# Patient Record
Sex: Male | Born: 1995 | Race: White | Hispanic: No | Marital: Single | Smoking: Never smoker
Health system: Southern US, Community
[De-identification: ages and names within clinical notes are randomized; demographics above are authoritative.]

---
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
## 2017-02-26 DIAGNOSIS — L74519 Primary focal hyperhidrosis, unspecified: Secondary | ICD-10-CM | POA: Diagnosis not present

## 2017-05-18 DIAGNOSIS — J029 Acute pharyngitis, unspecified: Secondary | ICD-10-CM | POA: Diagnosis not present

## 2017-05-21 DIAGNOSIS — J069 Acute upper respiratory infection, unspecified: Secondary | ICD-10-CM | POA: Diagnosis not present

## 2017-06-08 DIAGNOSIS — J029 Acute pharyngitis, unspecified: Secondary | ICD-10-CM | POA: Diagnosis not present

## 2017-07-13 IMAGING — MR MR SHOULDER*R* W/CM
4 of 6 series · 19 of 40 positions shown · IV contrast (agent unspecified)
Comparison: MR arthrogram dated 01/01/2012

CLINICAL DATA: Right shoulder pain and weakness.

EXAM:
MR ARTHROGRAM OF THE RIGHT SHOULDER
TECHNIQUE: Multiplanar, multisequence MR imaging of the right shoulder was
performed following the administration of intra-articular contrast.
CONTRAST:  See Injection Documentation.

[Series 3: T2 fat-sat · sagittal · 4.0mm · 0.44mm/px · 8 of 18 slices shown]
[im 1/18]
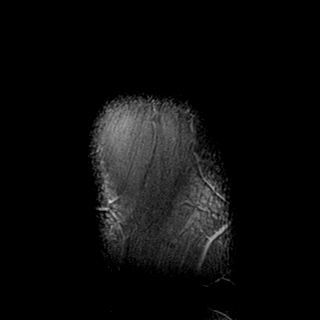
[im 3/18]
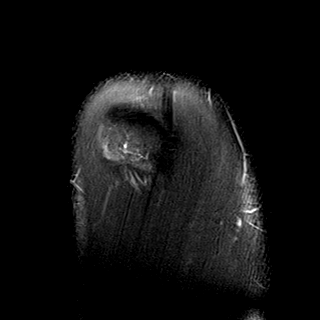
[im 5/18]
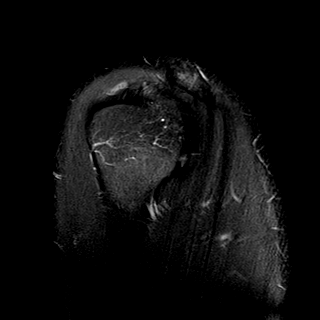
[im 8/18]
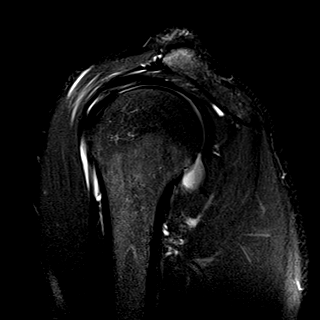
[im 10/18]
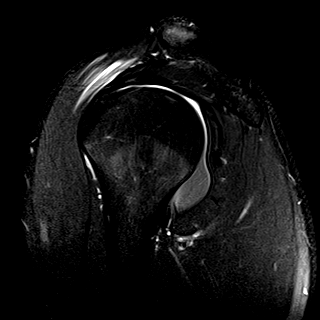
[im 13/18]
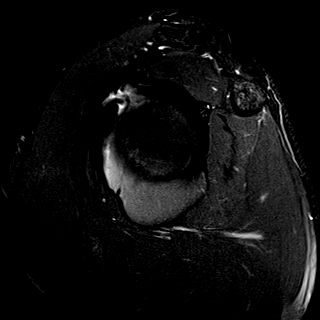
[im 15/18]
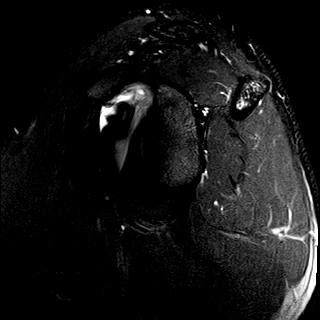
[im 18/18]
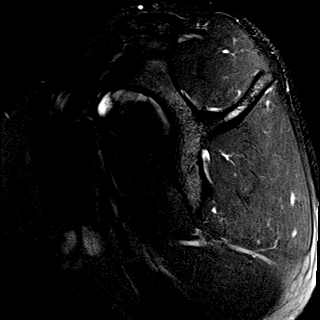

[Series 4: T1 fat-sat · oblique · 4.0mm · 0.22mm/px · 5 of 16 slices shown (1 of 2)]
[im 1/16]
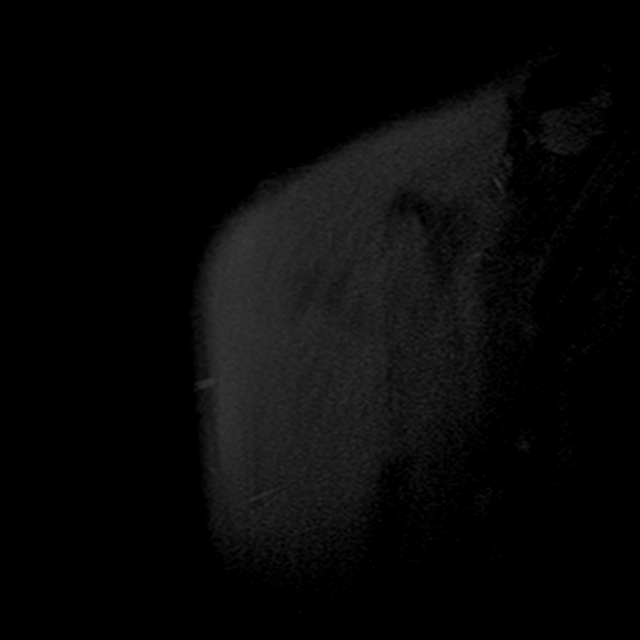
[im 4/16]
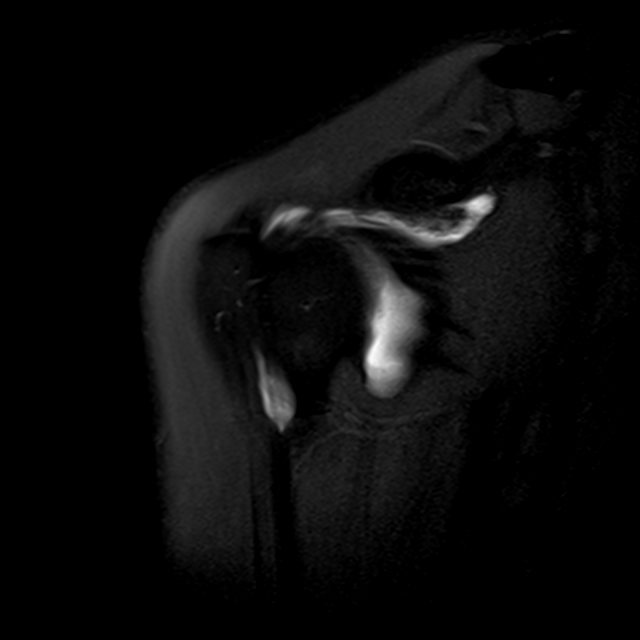
[im 7/16]
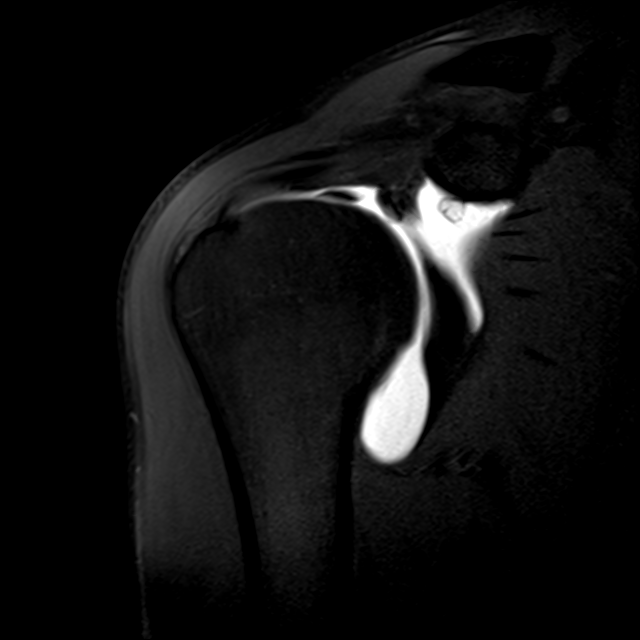
[im 10/16]
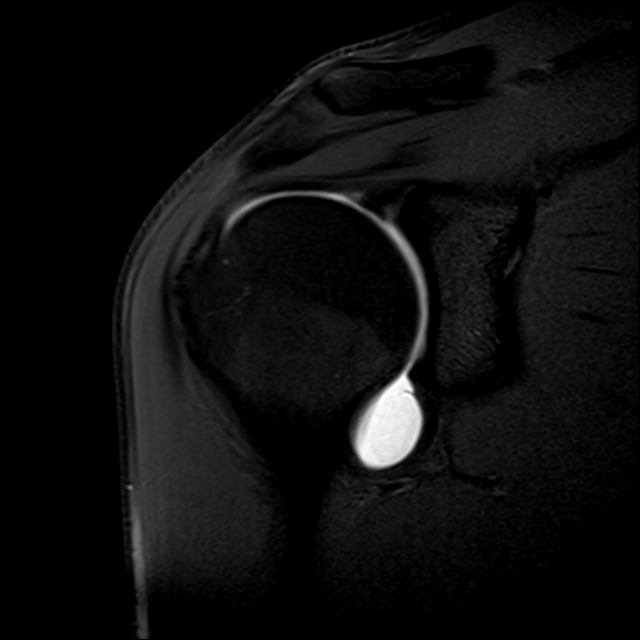
[im 16/16]
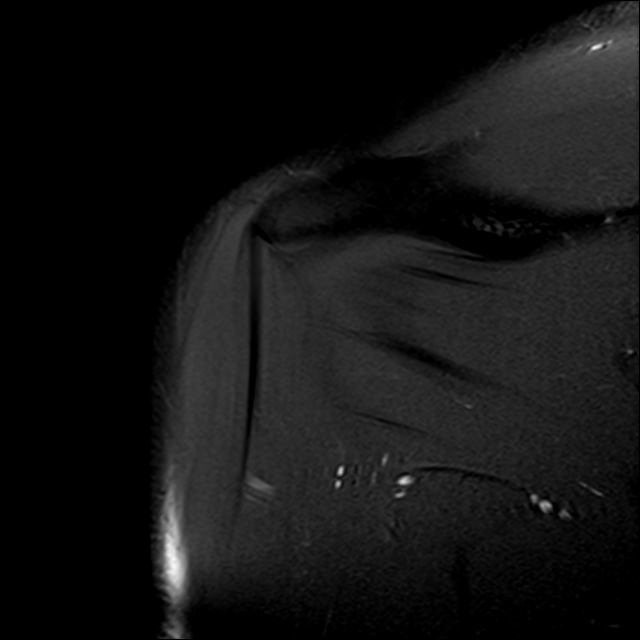

[Series 5: T1 · oblique · 4.0mm · 0.22mm/px · 3 of 16 slices shown]
[im 4/16]
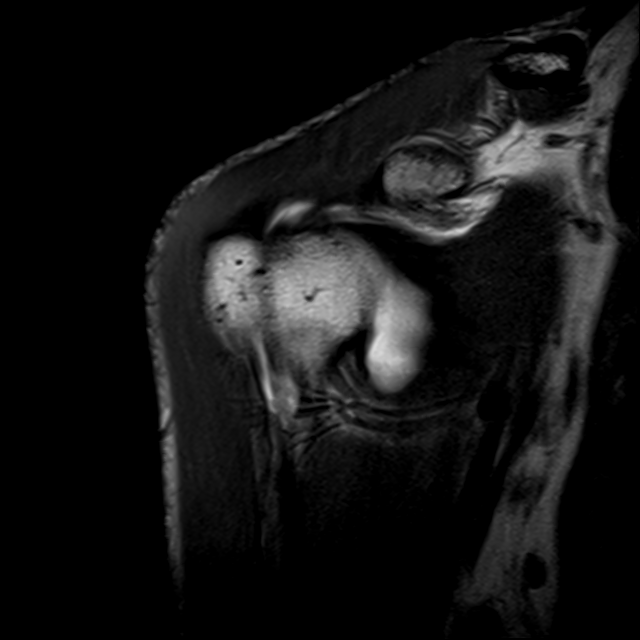
[im 10/16]
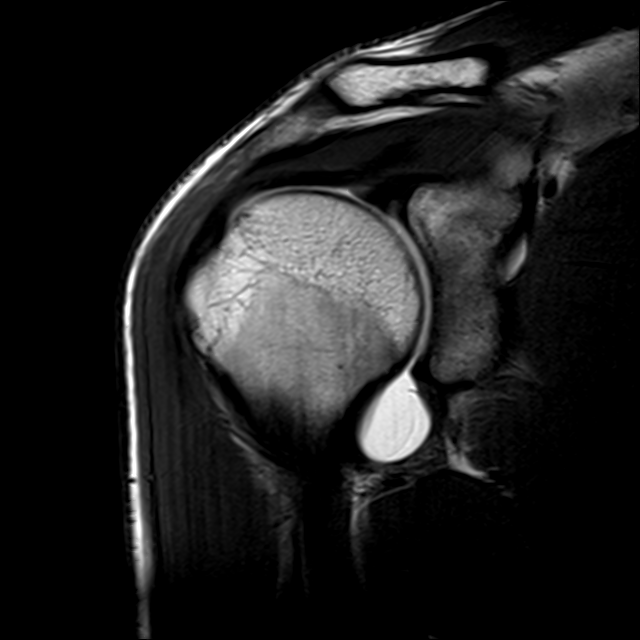
[im 16/16]
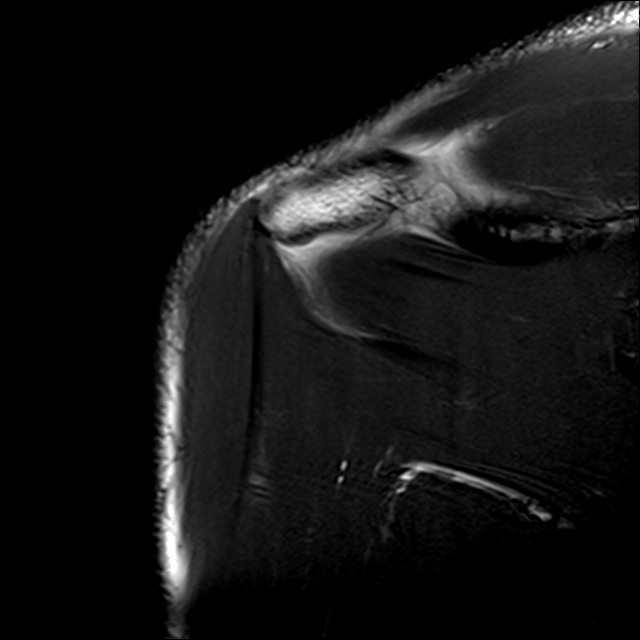

[Series 6: T1 fat-sat · axial · 4.0mm · 0.22mm/px · z∈[-47,+19]mm · 3 of 22 slices shown (2 of 2)]
[im 4/22]
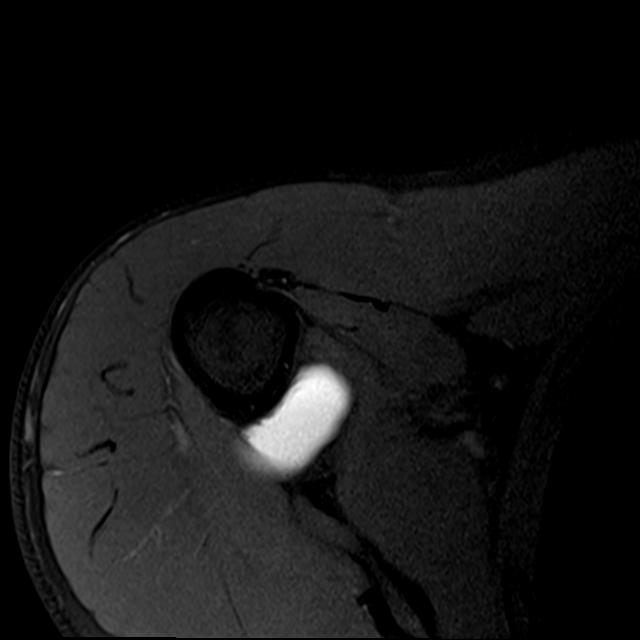
[im 13/22]
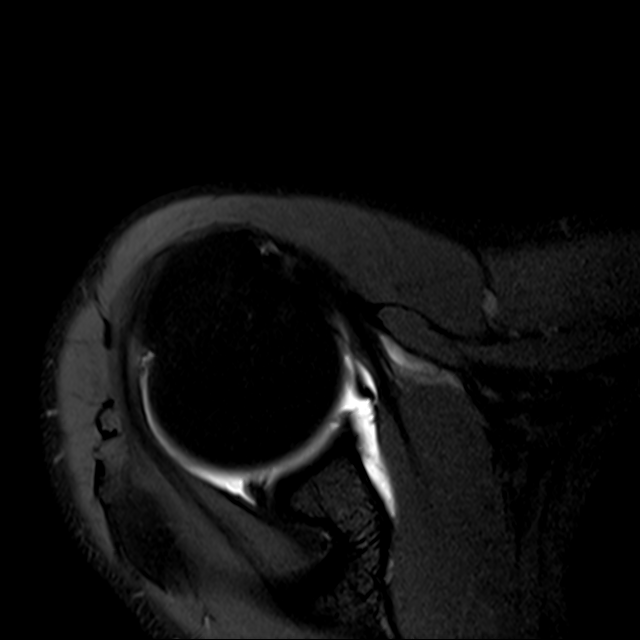
[im 19/22]
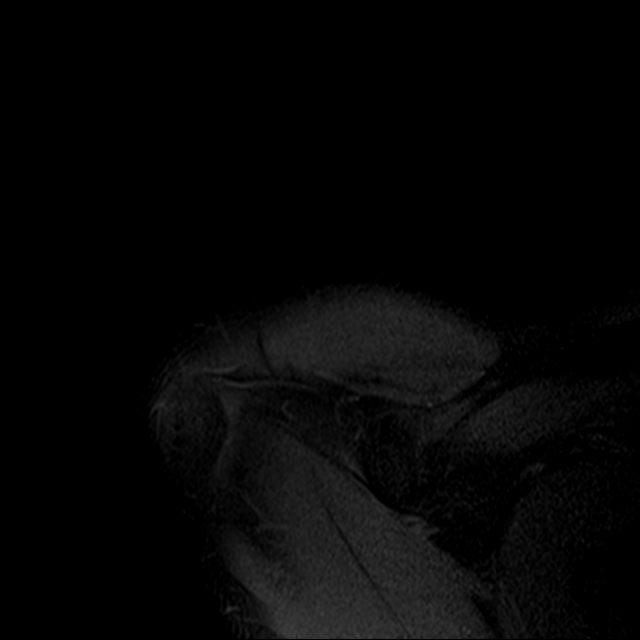

[19 of 40 positions shown; findings below may reference images not displayed]

FINDINGS: Rotator cuff: Normal.

Muscles: Normal.

Biceps long head: Properly located and intact.

Acromioclavicular Joint: Normal type 1 acromion. No bursitis. There
is lateral downsloping acromion.

Glenohumeral Joint: Normal.

Labrum: Normal.

Bones: Normal.
IMPRESSION: Lateral downsloping of the acromion which might predispose to
impingement. Otherwise, normal MR arthrogram of the right shoulder.

## 2018-02-09 ENCOUNTER — Ambulatory Visit: Payer: Commercial Managed Care - PPO | Admitting: Podiatry

## 2018-02-09 ENCOUNTER — Encounter: Payer: Self-pay | Admitting: Podiatry

## 2018-02-09 DIAGNOSIS — Q6650 Congenital pes planus, unspecified foot: Secondary | ICD-10-CM | POA: Diagnosis not present

## 2018-02-09 DIAGNOSIS — L308 Other specified dermatitis: Secondary | ICD-10-CM | POA: Diagnosis not present

## 2018-02-09 DIAGNOSIS — D2261 Melanocytic nevi of right upper limb, including shoulder: Secondary | ICD-10-CM | POA: Diagnosis not present

## 2018-02-09 DIAGNOSIS — M722 Plantar fascial fibromatosis: Secondary | ICD-10-CM | POA: Diagnosis not present

## 2018-02-09 DIAGNOSIS — D2262 Melanocytic nevi of left upper limb, including shoulder: Secondary | ICD-10-CM | POA: Diagnosis not present

## 2018-02-09 DIAGNOSIS — L2089 Other atopic dermatitis: Secondary | ICD-10-CM | POA: Diagnosis not present

## 2018-02-09 NOTE — Progress Notes (Signed)
Subjective: 21 year old male presents the office today requesting new orthotics.  He states his old ones are worn out.  He states that in general he is having no significant pain to his feet denies any swelling or redness or any recent injury.  He has no other concerns today. Denies any systemic complaints such as fevers, chills, nausea, vomiting. No acute changes since last appointment, and no other complaints at this time.   Objective: AAO x3, NAD DP/PT pulses palpable bilaterally, CRT less than 3 seconds There is a decrease in medial arch upon weightbearing.  Ankle, subtalar, midtarsal range of motion intact but any restrictions.  No area of tenderness identified bilaterally there is no edema or erythema.  Subjectively he gets some discomfort on the arch of the foot if he does not wear the orthotics. No open lesions or pre-ulcerative lesions.  No pain with calf compression, swelling, warmth, erythema  Assessment: Bilateral flatfoot deformity, plantar fasciitis  Plan: -All treatment options discussed with the patient including all alternatives, risks, complications.  -He is having no pain today.  Discussed shoe modifications and he is also requesting orthotics.  Rick evaluate him today he was measured for new inserts.  Otherwise he is doing well.  I will have him follow-up with back in 3 weeks to pick up orthotics encouraged to call me with any questions or concerns any changes. -Patient encouraged to call the office with any questions, concerns, change in symptoms.    Trula Slade DPM

## 2018-02-12 DIAGNOSIS — H5213 Myopia, bilateral: Secondary | ICD-10-CM | POA: Diagnosis not present

## 2018-02-15 DIAGNOSIS — Z0100 Encounter for examination of eyes and vision without abnormal findings: Secondary | ICD-10-CM | POA: Diagnosis not present

## 2018-04-07 ENCOUNTER — Telehealth: Payer: Self-pay | Admitting: Podiatry

## 2018-04-07 NOTE — Telephone Encounter (Signed)
pts mother left message stating pt was in end of last year and was to be getting a new pair of orthotics but they have not heard anything.  I do not see where they were ordered in [place]. Liliane Channel can you check on it

## 2018-05-17 ENCOUNTER — Telehealth: Payer: Self-pay | Admitting: Podiatry

## 2018-05-17 NOTE — Telephone Encounter (Signed)
Spoke to pts mom last week to see if ok to mail the orthotics to the pt and she said that would be fine.She gave me his address in [place] to mail it to them. Address is [address] [place] [address].. I did mail it out.  Then received a voicemail  from pts mom stating she gave me the wrong zipcode.  I contacted pts mom and told her they were already mailed out. She asked if we could track them and I told her no ma'am it went thru the regular mail. But it I do get them returned to me I would call her and get the corrected zipcode.

## 2020-01-27 ENCOUNTER — Ambulatory Visit: Payer: Commercial Managed Care - PPO
